# Patient Record
Sex: Female | Born: 1965 | Race: White | Hispanic: No | Marital: Married | Smoking: Never smoker
Health system: Southern US, Community
[De-identification: ages and names within clinical notes are randomized; demographics above are authoritative.]

## PROBLEM LIST (undated history)

## (undated) HISTORY — PX: NOVASURE ABLATION: SHX5394

## (undated) HISTORY — PX: LAPAROSCOPY: SHX197

---

## 2001-01-21 ENCOUNTER — Other Ambulatory Visit: Admission: RE | Admit: 2001-01-21 | Discharge: 2001-01-21 | Payer: Self-pay | Admitting: *Deleted

## 2001-07-20 ENCOUNTER — Inpatient Hospital Stay (HOSPITAL_COMMUNITY): Admission: AD | Admit: 2001-07-20 | Discharge: 2001-07-23 | Payer: Self-pay | Admitting: *Deleted

## 2001-07-20 ENCOUNTER — Encounter (INDEPENDENT_AMBULATORY_CARE_PROVIDER_SITE_OTHER): Payer: Self-pay | Admitting: Specialist

## 2001-08-30 ENCOUNTER — Other Ambulatory Visit: Admission: RE | Admit: 2001-08-30 | Discharge: 2001-08-30 | Payer: Self-pay | Admitting: *Deleted

## 2002-04-14 HISTORY — PX: BREAST ENHANCEMENT SURGERY: SHX7

## 2002-12-13 ENCOUNTER — Other Ambulatory Visit: Admission: RE | Admit: 2002-12-13 | Discharge: 2002-12-13 | Payer: Self-pay | Admitting: Gynecology

## 2004-03-05 ENCOUNTER — Encounter: Admission: RE | Admit: 2004-03-05 | Discharge: 2004-03-05 | Payer: Self-pay | Admitting: Gynecology

## 2004-03-19 ENCOUNTER — Other Ambulatory Visit: Admission: RE | Admit: 2004-03-19 | Discharge: 2004-03-19 | Payer: Self-pay | Admitting: Gynecology

## 2005-03-21 ENCOUNTER — Other Ambulatory Visit: Admission: RE | Admit: 2005-03-21 | Discharge: 2005-03-21 | Payer: Self-pay | Admitting: Gynecology

## 2005-03-27 ENCOUNTER — Encounter: Admission: RE | Admit: 2005-03-27 | Discharge: 2005-03-27 | Payer: Self-pay | Admitting: Gynecology

## 2005-04-25 ENCOUNTER — Ambulatory Visit (HOSPITAL_COMMUNITY): Admission: RE | Admit: 2005-04-25 | Discharge: 2005-04-25 | Payer: Self-pay | Admitting: Gynecology

## 2005-04-25 ENCOUNTER — Encounter (INDEPENDENT_AMBULATORY_CARE_PROVIDER_SITE_OTHER): Payer: Self-pay | Admitting: Specialist

## 2006-04-10 ENCOUNTER — Other Ambulatory Visit: Admission: RE | Admit: 2006-04-10 | Discharge: 2006-04-10 | Payer: Self-pay | Admitting: Gynecology

## 2007-08-03 ENCOUNTER — Other Ambulatory Visit: Admission: RE | Admit: 2007-08-03 | Discharge: 2007-08-03 | Payer: Self-pay | Admitting: Gynecology

## 2007-10-12 ENCOUNTER — Encounter: Admission: RE | Admit: 2007-10-12 | Discharge: 2007-10-12 | Payer: Self-pay | Admitting: Gynecology

## 2008-08-04 ENCOUNTER — Encounter: Payer: Self-pay | Admitting: Gynecology

## 2008-08-04 ENCOUNTER — Ambulatory Visit: Payer: Self-pay | Admitting: Gynecology

## 2008-08-04 ENCOUNTER — Other Ambulatory Visit: Admission: RE | Admit: 2008-08-04 | Discharge: 2008-08-04 | Payer: Self-pay | Admitting: Gynecology

## 2008-09-26 ENCOUNTER — Ambulatory Visit: Payer: Self-pay | Admitting: Gynecology

## 2008-10-02 ENCOUNTER — Ambulatory Visit: Payer: Self-pay | Admitting: Gynecology

## 2008-10-05 ENCOUNTER — Ambulatory Visit: Payer: Self-pay | Admitting: Women's Health

## 2008-10-12 ENCOUNTER — Encounter: Admission: RE | Admit: 2008-10-12 | Discharge: 2008-10-12 | Payer: Self-pay | Admitting: Gynecology

## 2009-06-26 ENCOUNTER — Ambulatory Visit: Payer: Self-pay | Admitting: Gynecology

## 2009-07-10 ENCOUNTER — Ambulatory Visit: Payer: Self-pay | Admitting: Gynecology

## 2010-04-05 ENCOUNTER — Ambulatory Visit: Payer: Self-pay | Admitting: Gynecology

## 2010-05-05 ENCOUNTER — Encounter: Payer: Self-pay | Admitting: Gynecology

## 2010-05-24 ENCOUNTER — Ambulatory Visit (HOSPITAL_BASED_OUTPATIENT_CLINIC_OR_DEPARTMENT_OTHER)
Admission: RE | Admit: 2010-05-24 | Discharge: 2010-05-24 | Disposition: A | Discharge status: Home / Self Care | Payer: BC Managed Care – PPO | Attending: General Surgery | Admitting: General Surgery

## 2010-05-24 ENCOUNTER — Other Ambulatory Visit: Payer: Self-pay | Admitting: General Surgery

## 2010-05-24 DIAGNOSIS — D1739 Benign lipomatous neoplasm of skin and subcutaneous tissue of other sites: Secondary | ICD-10-CM | POA: Insufficient documentation

## 2010-05-24 LAB — POCT HEMOGLOBIN-HEMACUE: Hemoglobin: 12.4 g/dL (ref 12.0–15.0)

## 2010-05-30 NOTE — Op Note (Addendum)
  NAME:  Anita Mason, Anita Mason               ACCOUNT NO.:  [id number]  MEDICAL RECORD NO.:  [id number]           PATIENT TYPE:  LOCATION:                                 FACILITY:  PHYSICIAN:  Lennie Muckle, MD      DATE OF BIRTH:  [birth year]  DATE OF PROCEDURE:  05/24/2010 DATE OF DISCHARGE:                              OPERATIVE REPORT   PREOPERATIVE DIAGNOSIS:  Lipoma, right chest wall.  POSTOPERATIVE DIAGNOSIS:  Lipoma, right chest wall.  PROCEDURE:  Excision of lipoma.  SURGEON:  Lennie Muckle, MD  ASSISTANT:  OR staff.  ESTIMATED BLOOD LOSS:  Minimal amount of blood loss.  ANESTHESIA:  General endotracheal anesthesia.  SPECIMENS:  Lipomatous tissue, 4 x 3.  COMPLICATIONS:  No immediate complications.  INDICATIONS FOR PROCEDURE:  Ms. __________  is a 45 year old female who had a right swelling on her chest wall.  It had been present for some years.  It is rather uncomfortable due to her activity.  She desired to have this excised.  DETAILS OF PROCEDURE:  Ms. __________  was identified in the preoperative holding area.  I marked the area along with her in the preoperative holding area.  She was seen by Anesthesia and taken to the operating room.  Once in the operating room, she was placed in supine position.  After administration of anesthesia, the right chest wall was prepped and draped in usual sterile fashion.  Surgical time-out performed.  I began by injecting 0.25% Marcaine into the tissues and placed a small incision with a #15 blade, dissected with electrocautery. The specimen was loosely adherent to the external oblique muscles.  I was able to fully remove this intact with electrocautery.  There was minimal amount of bleeding.  It was passed off the operative field.  I injected Marcaine into external oblique muscles.  I inspected and found no bleeding.  I reapproximated the dermis with a 3-0 Vicryl.  Skin was closed with 4-0 Monocryl, placed Steri-Strips, dry  gauze, and Tegaderm for final dressing.  The patient was awoken and transferred to postanesthesia care unit in stable condition.  She will be discharged home with Vicodin and followup with me in 2-3 weeks.     Lennie Muckle, MD     ALA/MEDQ  D:  05/24/2010  T:  05/25/2010  Job:  [id number]  Electronically Signed by Bertram Savin MD on 05/30/2010 02:17:04 PM

## 2010-08-30 NOTE — Op Note (Signed)
NAME:  Anita Mason, Anita Mason               ACCOUNT NO.:  [id number]   MEDICAL RECORD NO.:  [id number]          PATIENT TYPE:  AMB   LOCATION:  [facility]                           FACILITY:  [facility]   PHYSICIAN:  Timothy P. Fontaine, M.D.DATE OF BIRTH:  01/18/1966   DATE OF PROCEDURE:  04/25/2005  DATE OF DISCHARGE:                                 OPERATIVE REPORT   PREOPERATIVE DIAGNOSES:  Menorrhagia.   POSTOPERATIVE DIAGNOSES:  Menorrhagia.   PROCEDURE:  NovaSure endometrial ablation, hysteroscopy D&C.   SURGEON:  Timothy P. Fontaine, M.D.   ANESTHESIA:  MAC with 1% lidocaine paracervical block.   SPECIMENS:  Endometrial curetting.   ESTIMATED BLOOD LOSS:  Minimal.   COMPLICATIONS:  None.   SORBITOL DISCREPANCY:  Minimal.   FINDINGS:  Post abortive hysteroscopy grossly normal, symmetrical ablation,  good distention, no evidence of perforation.   DESCRIPTION OF PROCEDURE:  The patient was taken to the operating room and  underwent MAC anesthesia, placed in the dorsal lithotomy position, received  a perineal vaginal preparation with Betadine solution, bladder emptied with  in and out Foley catheterization per nursing personnel. An EUA was performed  noting the uterus to be significantly anteverted. The cervix was visualized  with a tenaculum, anterior lip grasped with a single tooth tenaculum and a  paracervical block placed using 1% lidocaine approximately 8 mL total. The  uterus was then sounded to 8 cm, cervical length calculated at 3 cm and a  sharp curettage was performed and sent to pathology. The NovaSure was  placed. The array was opened and manipulation ensured proper placement. The  cavity width was calculated at 3.5. The carbon dioxide sleeve was advanced,  carbon dioxide test performed and passed. Subsequently the NovaSure ablation  performed without difficulty. The instrument was removed, hysteroscopy  showed empty  cavity, good distention, no evidence of perforation.  Instruments removed,  adequate hemostasis visualized, patient placed in supine position, awakened  without difficulty and taken to the recovery room in good condition having  tolerated the procedure well.      Timothy P. Fontaine, M.D.  Electronically Signed     TPF/MEDQ  D:  04/25/2005  T:  04/25/2005  Job:  [id number]

## 2010-08-30 NOTE — Discharge Summary (Signed)
[facility]  Patient:    Anita Mason, Anita Mason Visit Number: [id number] MRN: [id number]          Service Type: OBS Location: 910A [id number] [id number] Attending Physician:  Wetzel Bjornstad Dictated by:   Antony Contras, [facility] Admit Date:  07/20/2001 Discharge Date: 07/23/2001                             Discharge Summary  DISCHARGE DIAGNOSES:          1. Intrauterine pregnancy at 39 weeks.                               2. Elective repeat cesarean section.                               3. Multiparity, desires sterility.  PROCEDURE:                    Repeat low transverse cesarean section, postpartum tubal sterilization.  HISTORY OF PRESENT ILLNESS:   The patient is a 45 year old, gravida 2, para 0-1-0-2, LMP November 14, 2000, [facility] July 18, 2001.  Prenatal risk factors included advanced maternal age, also history of previous low transverse cesarean section, desires repeat, also a history of PIH at 35 weeks with previous pregnancy.  PRENATAL LABORATORY DATA:     Blood type O positive, antibody screen negative. RPR, HBSAG, HIV nonreactive.  Amniocentesis normal.  HOSPITAL COURSE:              The patient was admitted on July 20, 2001. Repeat low transverse cesarean section with tubal sterilization was performed by Dr. Penni Homans and assisted by Dr. Lily Peer under spinal anesthesia.  The patient was delivered of a 7 pound 2 ounce female with Apgars of 9 and 9, vertex position, three vessel cord, normal appearing uterus, tubes, and ovaries. Postpartum course the patient remained afebrile, had no difficulty voiding, and was able to be discharged in satisfactory condition on her third postoperative day.  CBC; hematocrit 24.5, hemoglobin 8.5, WBC 10.4, platelets 189.  DISPOSITION:                  Follow up in six weeks.  Continue prenatal vitamins and iron.  Motrin or Tylox for pain. Dictated by:   Antony Contras, [facility] Attending Physician:  Wetzel Bjornstad DD:   08/16/01 TD:  08/18/01 Job: [id number]

## 2010-08-30 NOTE — Op Note (Signed)
[facility]  Patient:    Anita Mason, Anita Mason Visit Number: [id number] MRN: [id number]          Service Type: OBS Location: [facility] Attending Physician:  Wetzel Bjornstad Dictated by:   Katy Fitch, M.D. Proc. Date: 07/20/01                             Operative Report  PREOPERATIVE DIAGNOSES:       1. A 39 week intrauterine pregnancy.                               2. Elective repeat cesarean section.                               3. Multiparity and desires sterility.  POSTOPERATIVE DIAGNOSES:      1. A 39 week intrauterine pregnancy.                               2. Elective repeat cesarean section.                               3. Multiparity and desires sterility.  OPERATION:                    Repeat low transverse cesarean section with                               postpartum tubal ligation.  SURGEON:                      Katy Fitch, M.D.  ASSISTANTGaetano Hawthorne. Lily Peer, M.D.  ANESTHESIA:                   Spinal.  ESTIMATED BLOOD LOSS:         750.  URINE OUTPUT:                 150 and clear.  FLUIDS:                       2700 of crystalloid.  COMPLICATIONS:                None.  SPECIMENS:                    Midportion of fallopian tubes bilaterally.  FINDINGS:                     Living 7 pound and 2 ounce female with Apgars of 9 and 9 in vertex position with a three vessel cord and normal-appearing uterus, tubes, ovaries, and placenta.  DESCRIPTION OF PROCEDURE:     The patient was taken to the operating room where spinal anesthesia was difficulty after finding the patient adequately anesthetized.  The patient was then prepped and draped in the normal sterile fashion.  A Foley catheter was inserted into the bladder without any difficulty.  A Pfannenstiel incision was made through the previous incision and carried down to  the underlying fascia.  The fascia was then extended transversely using curved  Mayo scissors.  The underlying rectus muscles were then dissected off both bluntly and sharply using curved Mayo scissors.  The rectus muscles were then separated in the midline and the underlying peritoneum was entered sharply.  The peritoneum was extended superiorly and then inferiorly to the level of the bladder.  A bladder blade was then placed in the pelvis, and noted on the uterus at the bladder reflection, multiple venous sinuses.  At this point, the incision was made approximately 3 cm above the level of the bladder flap without creating a bladder flap at that point due to the vascularity.  Entrance into the uterus was performed and the incision was extended manually.  The infants head was then delivered atraumatically along with the rest of the body and the mouth and nose were suctioned.  The cord was clamped and cut, and the infant handed off to the awaiting attendants.  Cord blood was obtained and the placenta was massaged from the uterus.  The uterus was then exteriorized and cleared of all clots and debris.  At this point, the bladder flap was then created using Metzenbaum scissors due to the venous sinuses being markedly diminished.  A running 1-0 chromic interlocking stitch was then used to close the lower uterine segment.  A pack was then placed along the incision and the posterior cul-de-sac was cleared of all clots and debris using warm normal saline, irrigation, and suction.  A Tanja Port was then placed on the left tube and a 0 plain was used to doubly ligate the tube and then excised with Metzenbaum scissors.  Inspection was noted to be hemostatic.  This was then done similarly on the right with double ligation with 0 plain.  The uterus was then placed back into the abdominal cavity without any difficulties.  Inspection of the tubes were noted to be hemostatic once again.  There were several areas of bleeders on the incision at which two figure-of-eights of chronic were  used to close.  A liter of irrigation was once again used and hemostasis was noted.  After irrigation, the fascia was then closed using 0 Vicryl in a running continuous stitch.  The subcutaneous tissue was irrigated with warm normal saline, and Bovie was used to cauterize any bleeders, and the skin was closed using staples.  The patient was taken to the recovery room in stable condition. Dictated by:   Katy Fitch, M.D. Attending Physician:  Wetzel Bjornstad DD:  07/20/01 TD:  07/20/01 Job: [id number]

## 2010-08-30 NOTE — H&P (Signed)
NAME:  Anita Mason, Anita Mason               ACCOUNT NO.:  [id number]   MEDICAL RECORD NO.:  [id number]          PATIENT TYPE:  AMB   LOCATION:  SDC                           FACILITY:  [facility]   PHYSICIAN:  Timothy P. Fontaine, M.D.DATE OF BIRTH:  [birth year]   DATE OF ADMISSION:  04/25/2005  DATE OF DISCHARGE:                                HISTORY & PHYSICAL   CHIEF COMPLAINT:  Menorrhagia.   HISTORY OF PRESENT ILLNESS:  A 45 year old gravida 2, para 3, female status  post tubal sterilization who complains of increasing menorrhagia.  The  patient underwent sonohysterogram which was normal and thyroid study which  was negative.  She does have a hemoglobin of 11.7 consistent with iron  deficiency.  Options for management were reviewed and she has elected for  endometrial ablation and is admitted at this time for NovaSure endometrial  ablation.   PAST MEDICAL HISTORY:  Uncomplicated.   PAST SURGICAL HISTORY:  Laparoscopy, cesarean section x2, tubal  sterilization.  Breast augmentation.   ALLERGIES:  No known drug allergies.   MEDICATIONS:  None.   REVIEW OF SYSTEMS:  Noncontributory.   FAMILY HISTORY:  Noncontributory.   SOCIAL HISTORY:  Noncontributory.   PHYSICAL EXAMINATION:  VITAL SIGNS:  Afebrile, vital signs are stable.  HEENT:  Normal.  LUNGS:  Clear.  HEART:  Regular rate, no murmurs, rubs, or gallops.  ABDOMEN:  Benign.  PELVIC:  External BUS and vagina normal.  Cervix normal.  Uterus normal  size, shape, and contour, midline and mobile.  Adnexa without masses or  tenderness.   ASSESSMENT:  A 45 year old gravida 2, para 3 female, status post tubal  sterilization, increasing menorrhagia, normal thyroid, normal  sonohysterogram, CBC consistent with iron deficiency for NovaSure  endometrial ablation.  The risks, benefits, indications, and alternatives  for the procedure were reviewed with the patient to include the expected  intraoperative, postoperative courses.  She  understands that she should  never achieve pregnancy following this procedure.  She has had a tubal  sterilization, but understands that she should never get pregnant following  this.  She also understands that the procedure is machine dependent, that if  there is a dysfunction in the procedure machine, that we would not be able  to do the ablation at that time.  She also understands there are no  guarantees as far as bleeding relief, that there are inherent failures, and  that her abnormal bleeding may persist.  The acute risks including bleeding,  infection, uterine perforation, damage to internal organs either directly or  thermally to bowel,  bladder, ureters, vessels, and nerves necessitating major exploratory  reparative surgeries and future reparative surgeries including ostomy  formation were all discussed, understood, and accepted.  The patient's  questions were answered to her satisfaction and she is ready to proceed with  surgery.      Timothy P. Fontaine, M.D.  Electronically Signed     TPF/MEDQ  D:  04/23/2005  T:  04/23/2005  Job:  [id number]

## 2010-10-29 ENCOUNTER — Ambulatory Visit (INDEPENDENT_AMBULATORY_CARE_PROVIDER_SITE_OTHER): Payer: BC Managed Care – PPO | Admitting: Gynecology

## 2010-10-29 DIAGNOSIS — N898 Other specified noninflammatory disorders of vagina: Secondary | ICD-10-CM

## 2010-10-29 DIAGNOSIS — R82998 Other abnormal findings in urine: Secondary | ICD-10-CM

## 2010-10-29 DIAGNOSIS — B373 Candidiasis of vulva and vagina: Secondary | ICD-10-CM

## 2010-10-30 ENCOUNTER — Encounter: Payer: Self-pay | Admitting: *Deleted

## 2011-01-16 ENCOUNTER — Other Ambulatory Visit: Payer: Self-pay | Admitting: Obstetrics and Gynecology

## 2011-05-08 ENCOUNTER — Other Ambulatory Visit: Payer: Self-pay | Admitting: Family Medicine

## 2011-05-08 DIAGNOSIS — R591 Generalized enlarged lymph nodes: Secondary | ICD-10-CM

## 2011-05-13 ENCOUNTER — Ambulatory Visit
Admission: RE | Admit: 2011-05-13 | Discharge: 2011-05-13 | Disposition: A | Discharge status: Home / Self Care | Payer: BC Managed Care – PPO | Source: Ambulatory Visit | Attending: Family Medicine | Admitting: Family Medicine

## 2011-05-13 DIAGNOSIS — R591 Generalized enlarged lymph nodes: Secondary | ICD-10-CM

## 2012-02-16 ENCOUNTER — Other Ambulatory Visit: Payer: Self-pay | Admitting: Obstetrics and Gynecology

## 2012-03-04 ENCOUNTER — Other Ambulatory Visit: Payer: Self-pay | Admitting: Obstetrics and Gynecology

## 2013-04-18 IMAGING — US US SOFT TISSUE HEAD/NECK
1 series · 14 of 22 positions shown · non-contrast
Comparison: None.

CLINICAL DATA: Left palpable cervical region without pain.

ULTRASOUND OF HEAD/NECK SOFT TISSUES
TECHNIQUE: Ultrasound examination of the head and neck soft
tissues was performed in the area of clinical concern.

[Series 1: us soft tissue head/neck · 0.07mm/px · 14 of 22 slices shown]
[im 1/22]
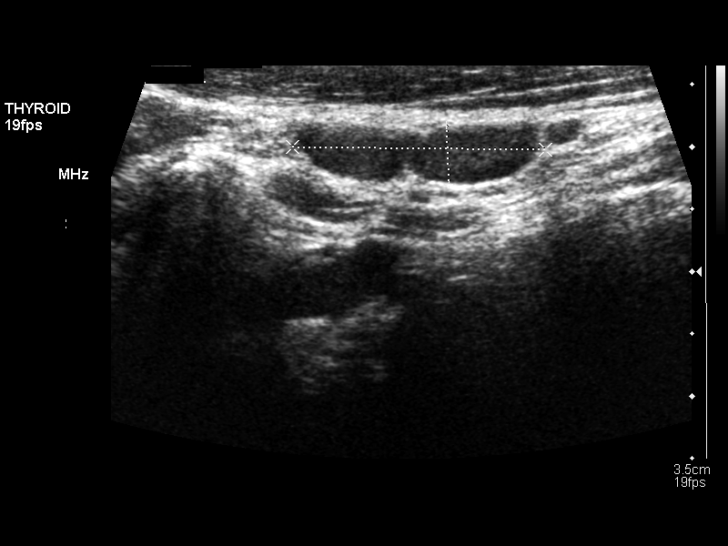
[im 3/22]
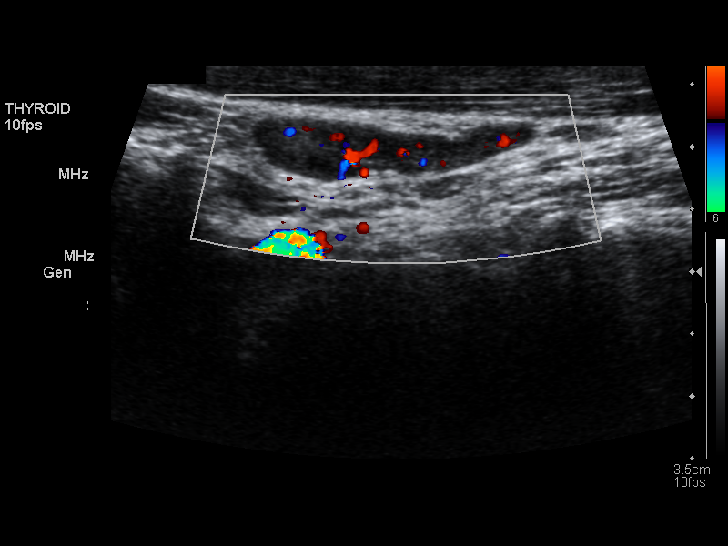
[im 4/22]
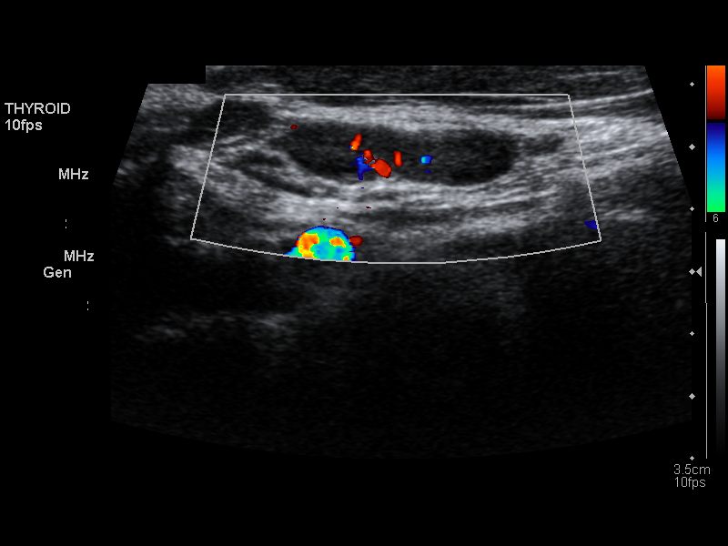
[im 6/22]
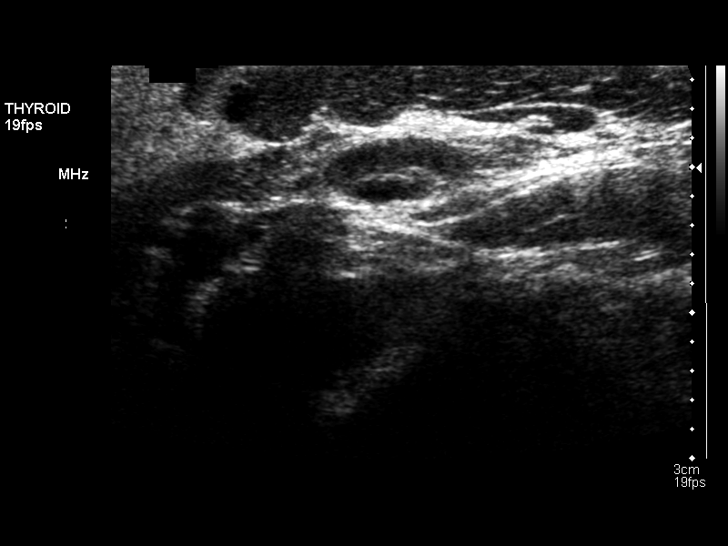
[im 8/22]
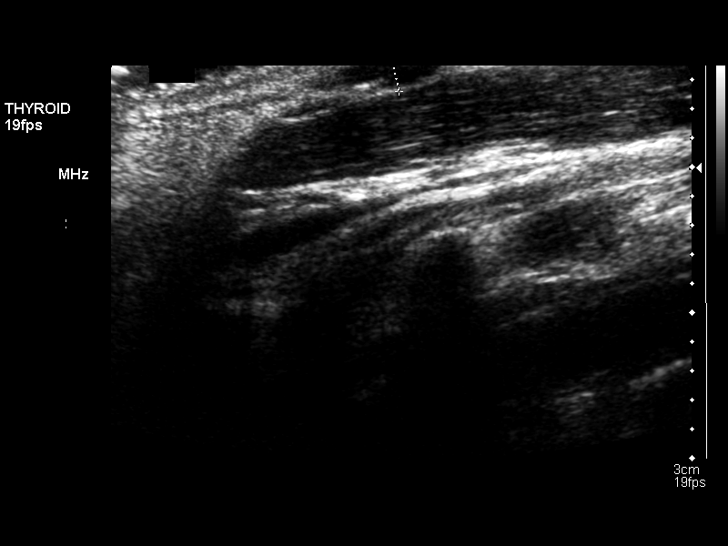
[im 9/22]
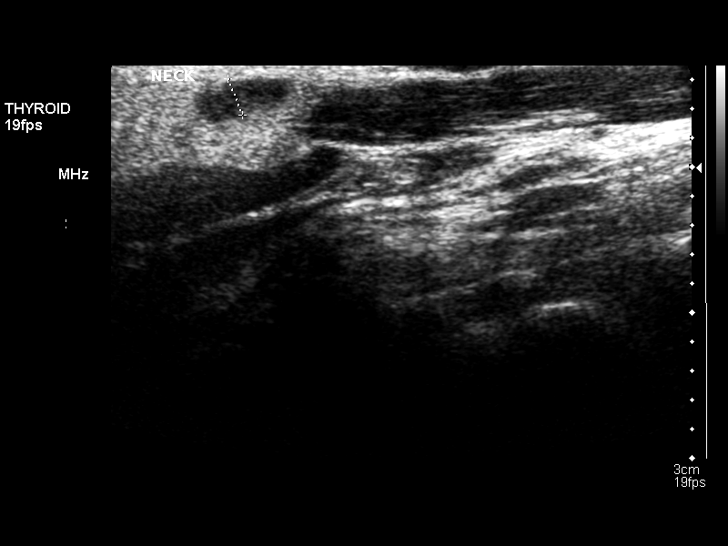
[im 11/22]
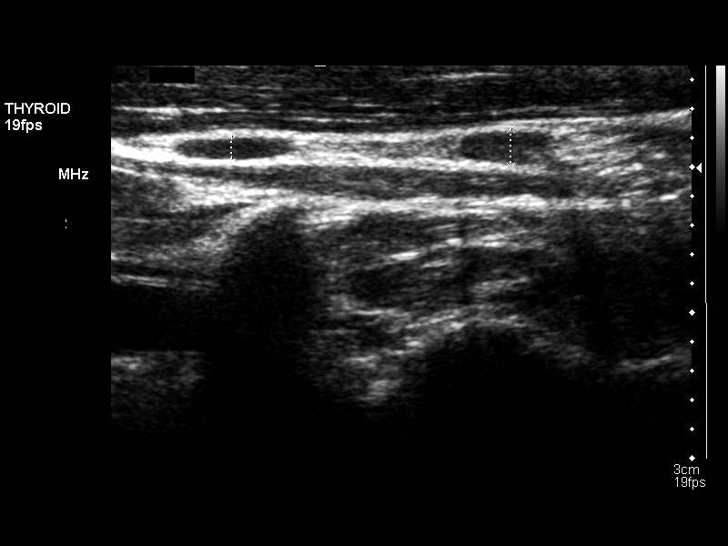
[im 12/22]
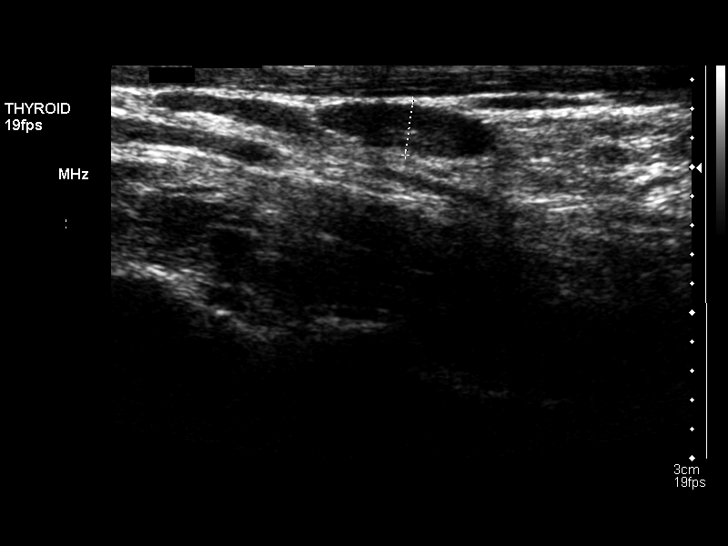
[im 14/22]
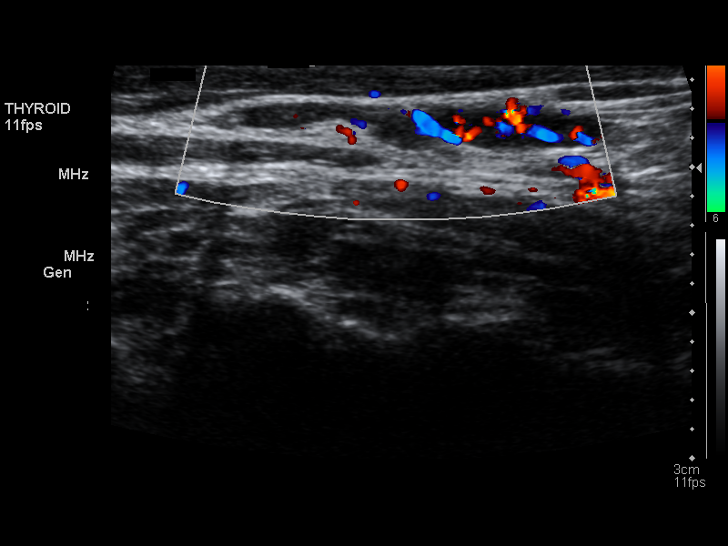
[im 15/22]
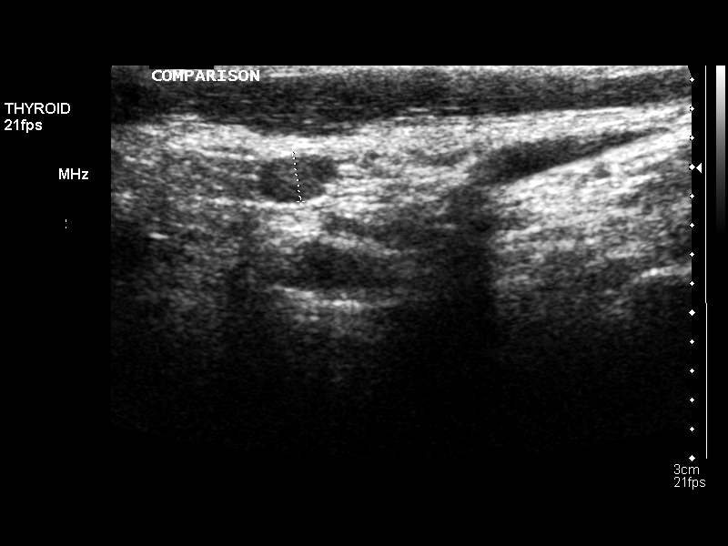
[im 17/22]
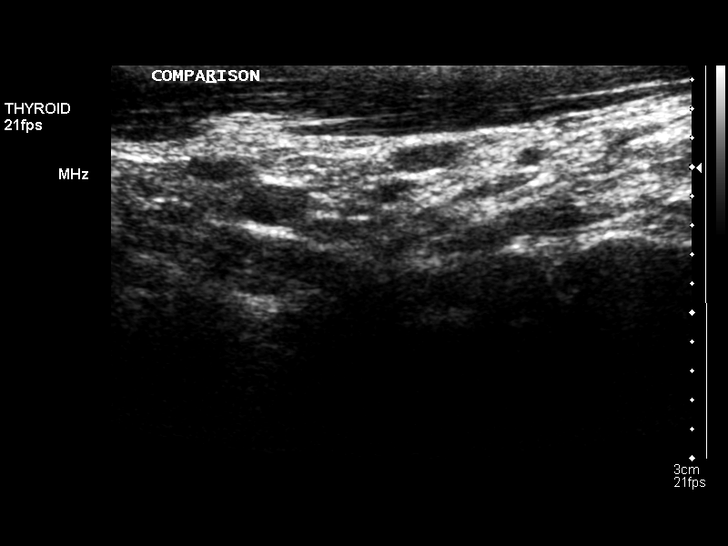
[im 19/22]
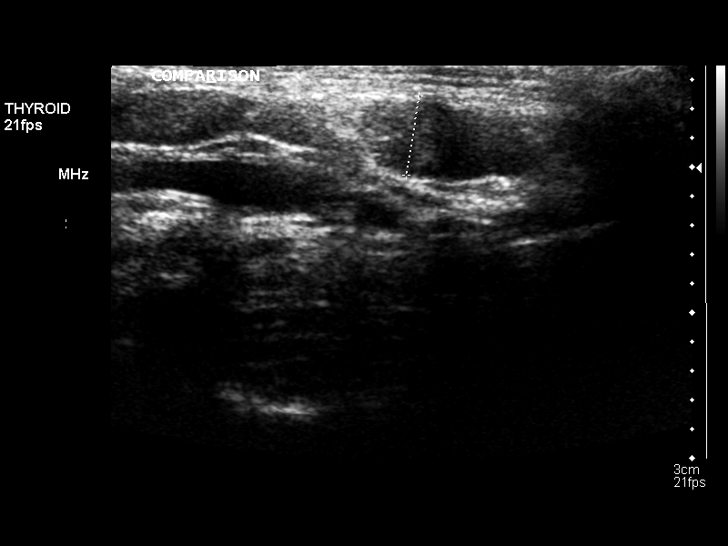
[im 20/22]
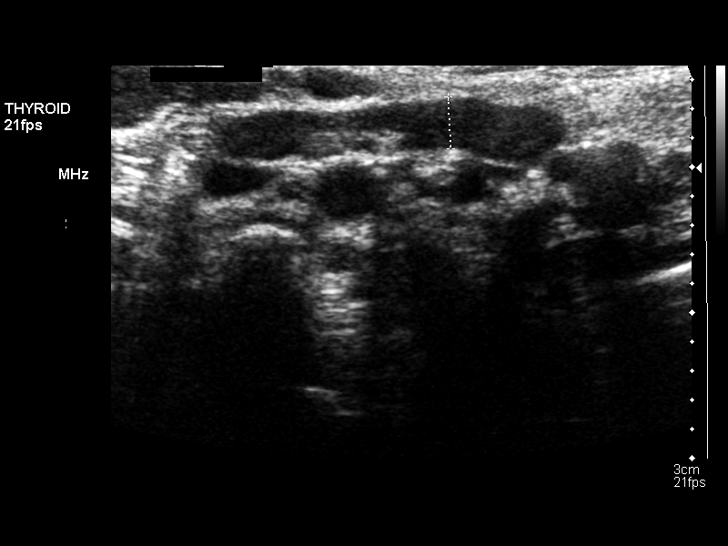
[im 22/22]
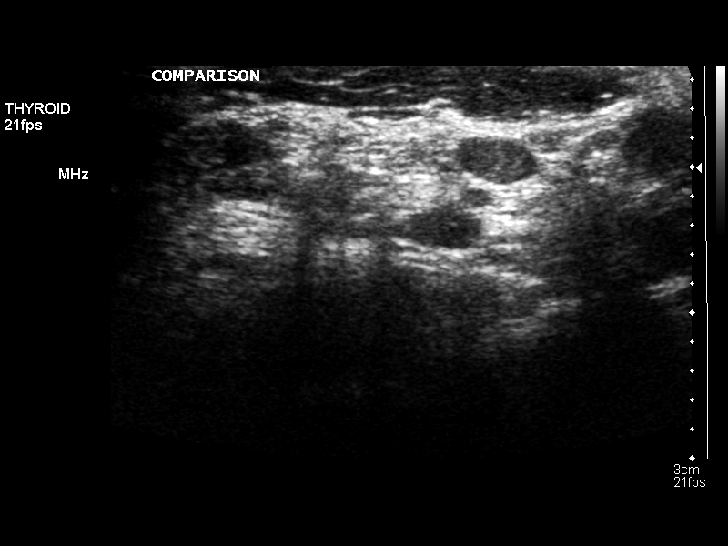

[14 of 22 positions shown; findings below may reference images not displayed]

FINDINGS: Survey ultrasound in the region of palpable concern in
the left lateral neck demonstrates scattered normal sized cervical
nodes, all measuring less than 5 mm short axis diameter.
Comparison views of the right neck demonstrate similar normal-
appearing cervical nodes.  No evidence of mass, fluid collection,
or adenopathy.
IMPRESSION: 1.  Normal bilateral cervical nodes by size criteria and
morphology.  No mass or other lesion.

## 2013-06-02 ENCOUNTER — Other Ambulatory Visit: Payer: Self-pay | Admitting: Family Medicine

## 2013-06-02 ENCOUNTER — Ambulatory Visit
Admission: RE | Admit: 2013-06-02 | Discharge: 2013-06-02 | Disposition: A | Discharge status: Home / Self Care | Payer: BC Managed Care – PPO | Source: Ambulatory Visit | Attending: Family Medicine | Admitting: Family Medicine

## 2013-06-02 DIAGNOSIS — T691XXA Chilblains, initial encounter: Secondary | ICD-10-CM

## 2014-02-20 ENCOUNTER — Other Ambulatory Visit: Payer: Self-pay | Admitting: Obstetrics and Gynecology

## 2014-02-21 LAB — CYTOLOGY - PAP

## 2015-05-09 IMAGING — CR DG FOOT COMPLETE 3+V*L*
3 series · 3 of 3 positions shown · non-contrast
Comparison: None.

CLINICAL DATA: Foot pain.

EXAM:
LEFT FOOT - COMPLETE 3+ VIEW

[view not recorded (1 of 3)]
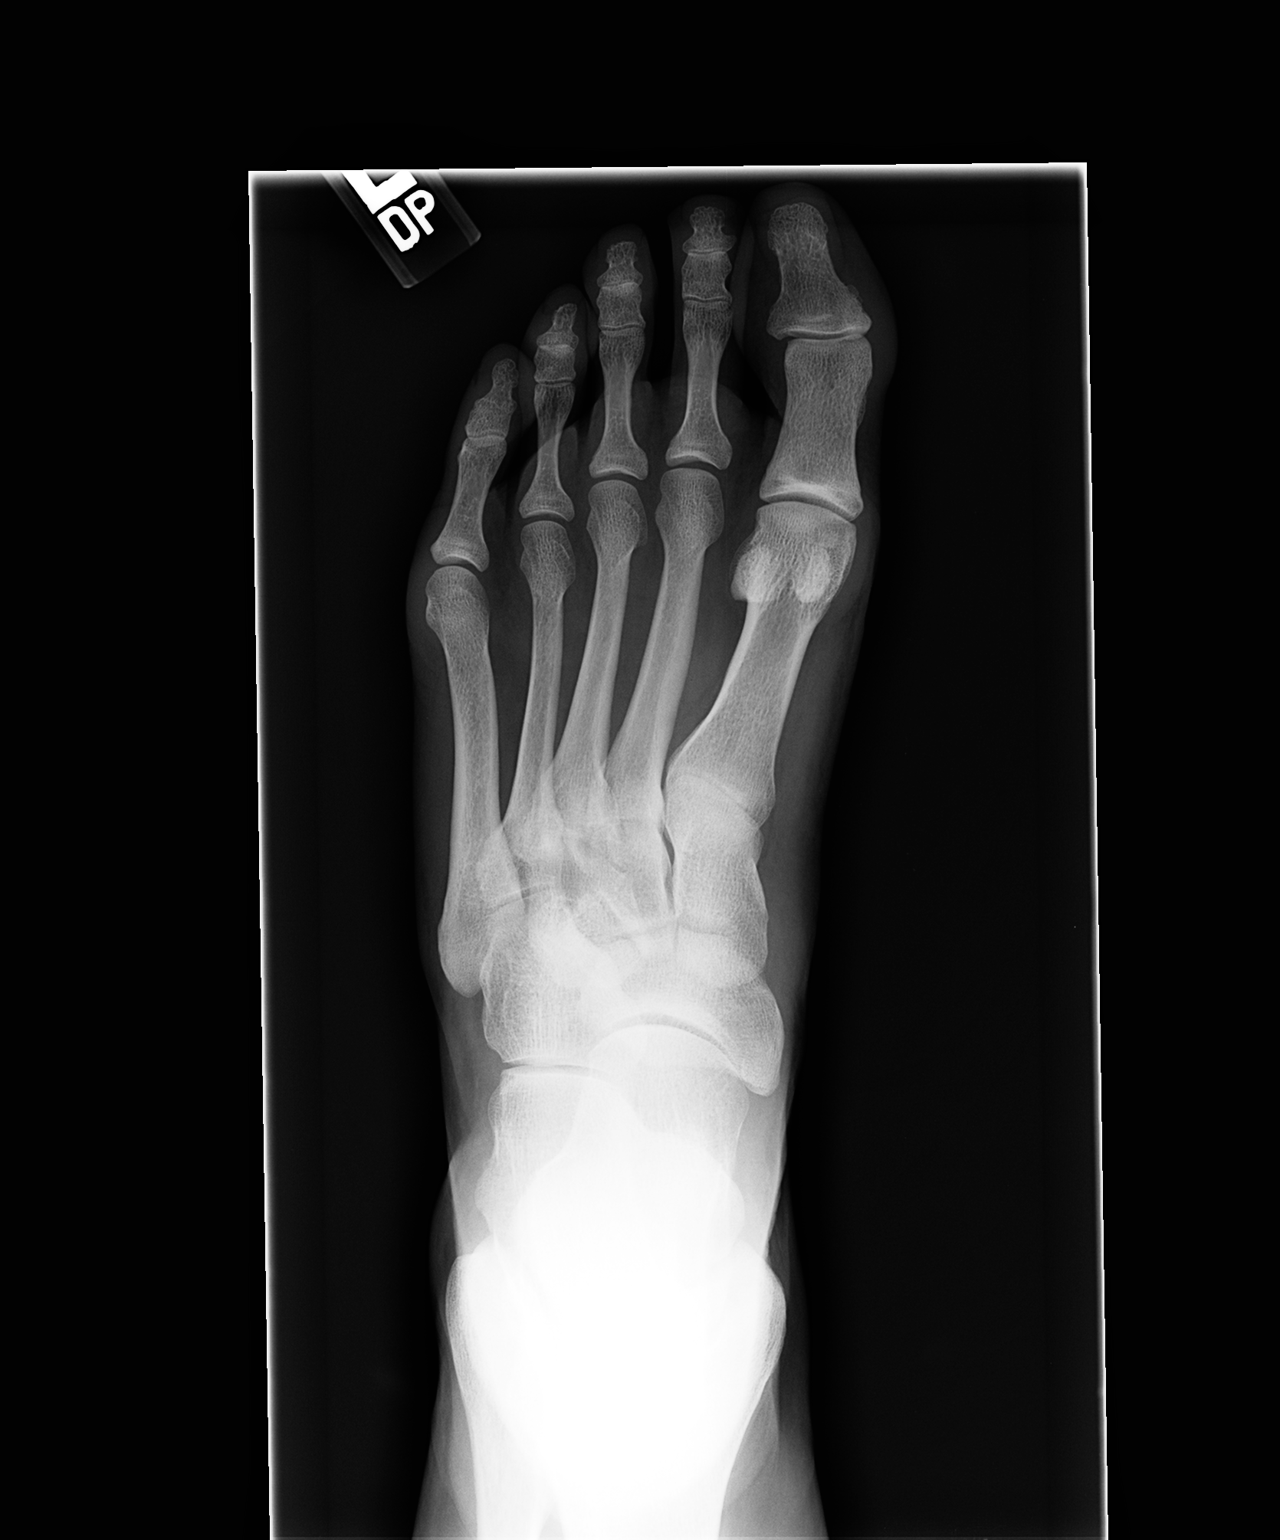

[view not recorded (2 of 3)]
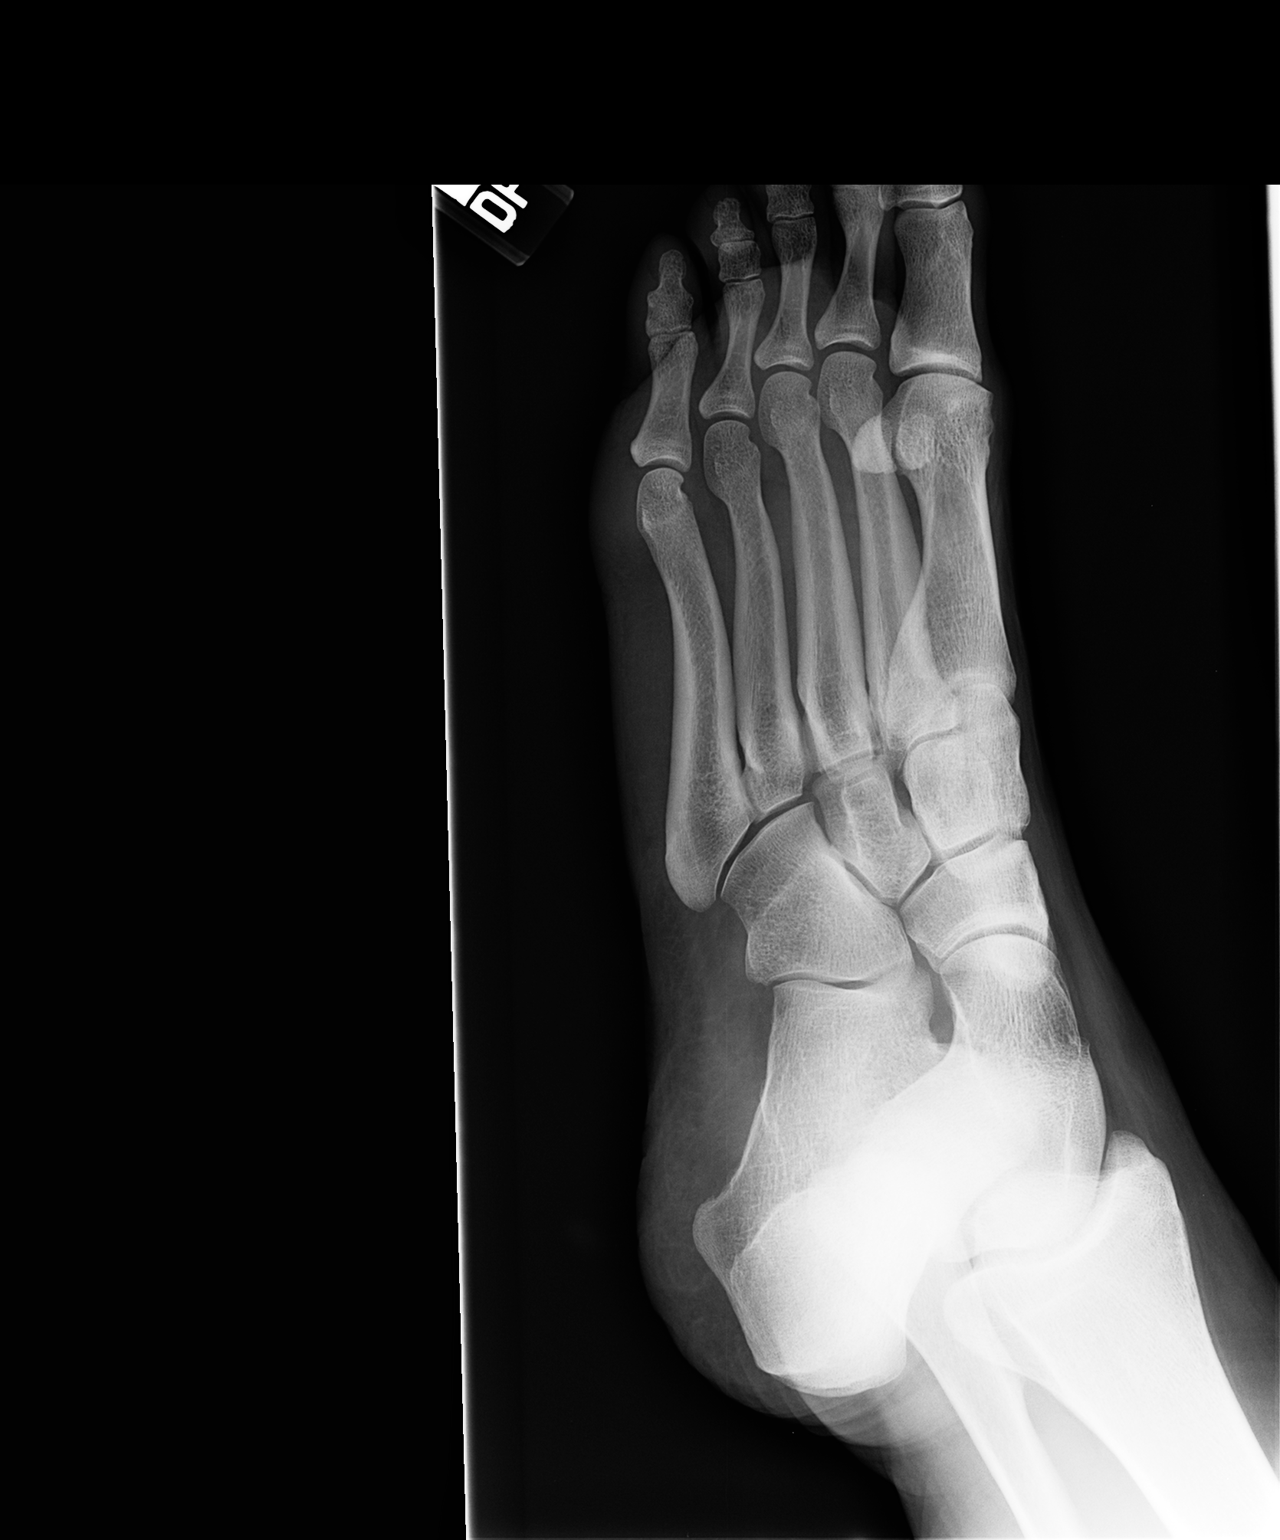

[view not recorded (3 of 3)]
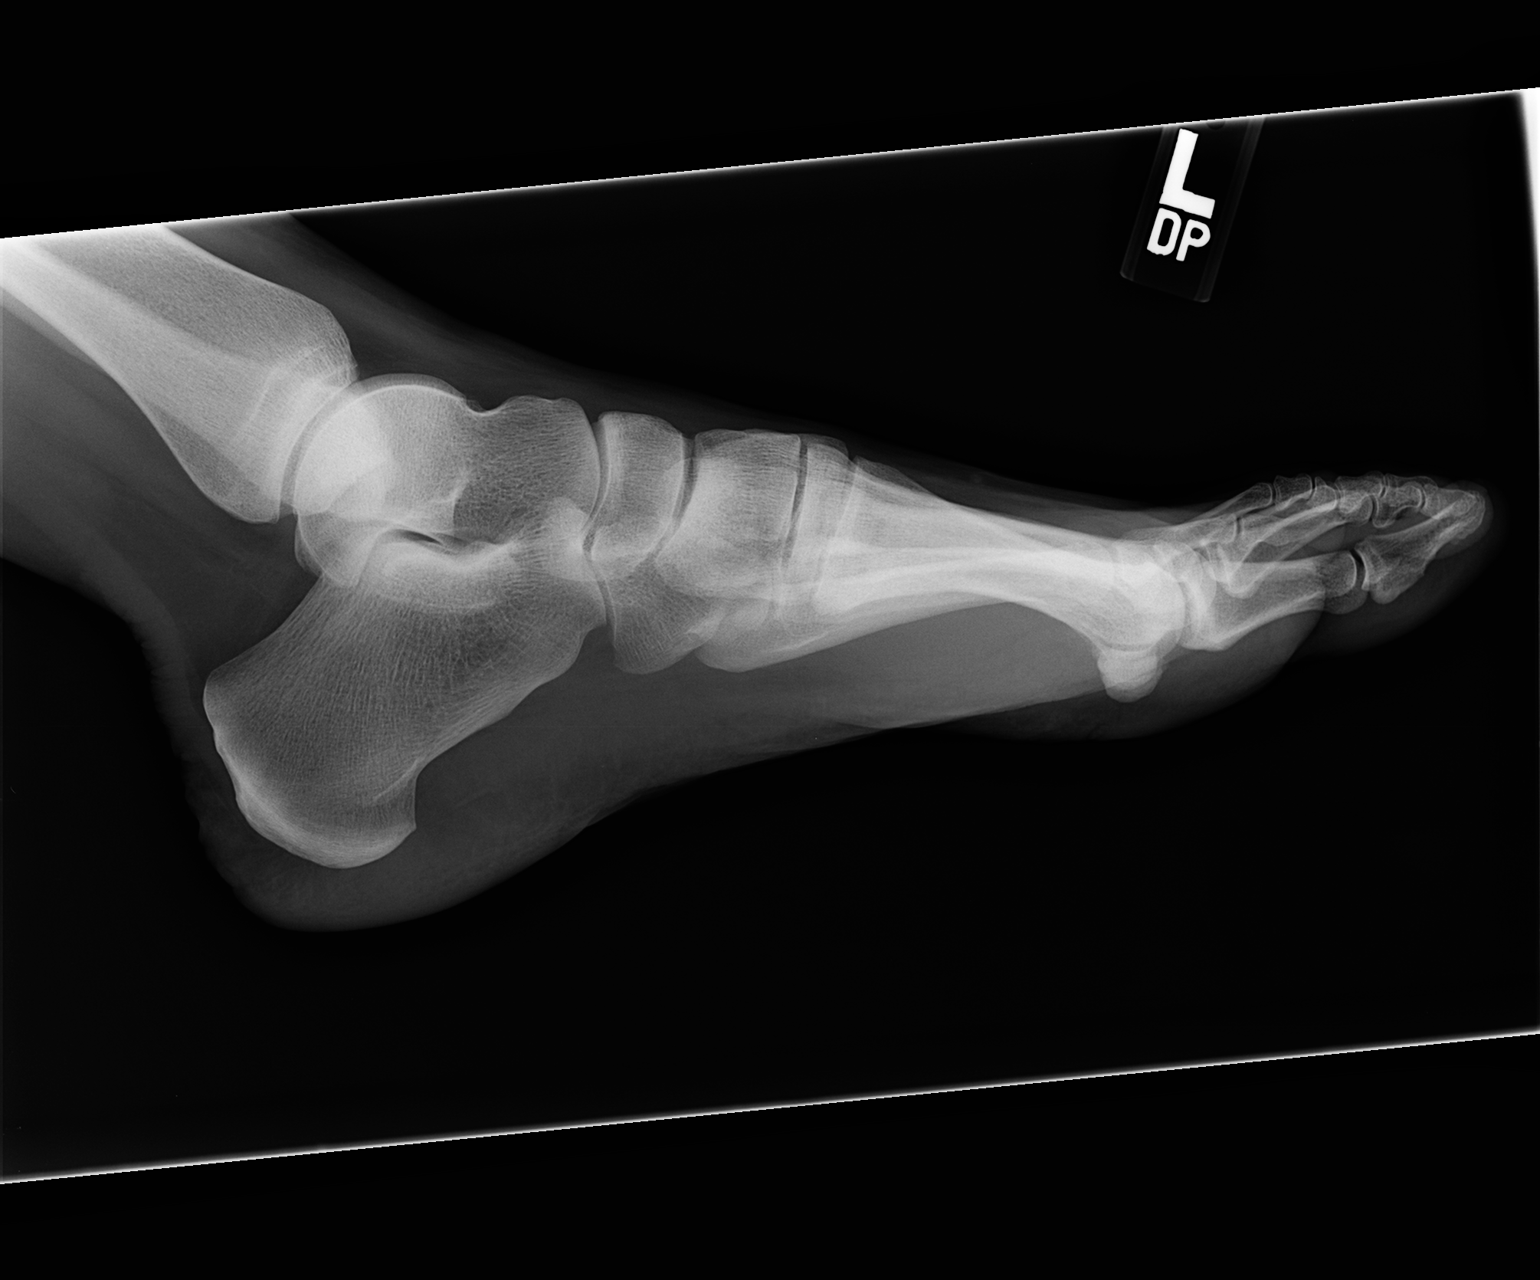

[3 of 3 positions shown; findings below may reference images not displayed]

FINDINGS: The joint spaces are maintained. No acute fracture. No degenerative
changes.
IMPRESSION: No acute bony findings.

## 2015-12-24 DIAGNOSIS — N950 Postmenopausal bleeding: Secondary | ICD-10-CM | POA: Diagnosis not present

## 2015-12-24 DIAGNOSIS — N939 Abnormal uterine and vaginal bleeding, unspecified: Secondary | ICD-10-CM | POA: Diagnosis not present

## 2016-04-13 DIAGNOSIS — J060 Acute laryngopharyngitis: Secondary | ICD-10-CM | POA: Diagnosis not present

## 2016-04-13 DIAGNOSIS — R05 Cough: Secondary | ICD-10-CM | POA: Diagnosis not present

## 2016-04-25 DIAGNOSIS — J329 Chronic sinusitis, unspecified: Secondary | ICD-10-CM | POA: Diagnosis not present

## 2016-04-25 DIAGNOSIS — R05 Cough: Secondary | ICD-10-CM | POA: Diagnosis not present

## 2016-06-18 DIAGNOSIS — N390 Urinary tract infection, site not specified: Secondary | ICD-10-CM | POA: Diagnosis not present

## 2016-07-18 DIAGNOSIS — N390 Urinary tract infection, site not specified: Secondary | ICD-10-CM | POA: Diagnosis not present

## 2016-10-20 DIAGNOSIS — Z6821 Body mass index (BMI) 21.0-21.9, adult: Secondary | ICD-10-CM | POA: Diagnosis not present

## 2016-10-20 DIAGNOSIS — Z1231 Encounter for screening mammogram for malignant neoplasm of breast: Secondary | ICD-10-CM | POA: Diagnosis not present

## 2016-10-20 DIAGNOSIS — Z01419 Encounter for gynecological examination (general) (routine) without abnormal findings: Secondary | ICD-10-CM | POA: Diagnosis not present

## 2016-10-23 DIAGNOSIS — Z0000 Encounter for general adult medical examination without abnormal findings: Secondary | ICD-10-CM | POA: Diagnosis not present

## 2016-10-23 DIAGNOSIS — Z23 Encounter for immunization: Secondary | ICD-10-CM | POA: Diagnosis not present

## 2016-10-23 DIAGNOSIS — Z79899 Other long term (current) drug therapy: Secondary | ICD-10-CM | POA: Diagnosis not present

## 2017-03-30 DIAGNOSIS — N921 Excessive and frequent menstruation with irregular cycle: Secondary | ICD-10-CM | POA: Diagnosis not present

## 2017-03-30 DIAGNOSIS — N950 Postmenopausal bleeding: Secondary | ICD-10-CM | POA: Diagnosis not present

## 2017-05-05 DIAGNOSIS — H40013 Open angle with borderline findings, low risk, bilateral: Secondary | ICD-10-CM | POA: Diagnosis not present

## 2017-05-05 DIAGNOSIS — H04123 Dry eye syndrome of bilateral lacrimal glands: Secondary | ICD-10-CM | POA: Diagnosis not present

## 2017-09-15 DIAGNOSIS — H40013 Open angle with borderline findings, low risk, bilateral: Secondary | ICD-10-CM | POA: Diagnosis not present

## 2017-09-15 DIAGNOSIS — H04123 Dry eye syndrome of bilateral lacrimal glands: Secondary | ICD-10-CM | POA: Diagnosis not present

## 2017-11-19 DIAGNOSIS — F4322 Adjustment disorder with anxiety: Secondary | ICD-10-CM | POA: Diagnosis not present

## 2017-12-04 DIAGNOSIS — Z13220 Encounter for screening for lipoid disorders: Secondary | ICD-10-CM | POA: Diagnosis not present

## 2017-12-04 DIAGNOSIS — Z79899 Other long term (current) drug therapy: Secondary | ICD-10-CM | POA: Diagnosis not present

## 2017-12-04 DIAGNOSIS — Z0000 Encounter for general adult medical examination without abnormal findings: Secondary | ICD-10-CM | POA: Diagnosis not present

## 2017-12-22 DIAGNOSIS — H04123 Dry eye syndrome of bilateral lacrimal glands: Secondary | ICD-10-CM | POA: Diagnosis not present

## 2017-12-22 DIAGNOSIS — H2513 Age-related nuclear cataract, bilateral: Secondary | ICD-10-CM | POA: Diagnosis not present

## 2017-12-22 DIAGNOSIS — H40013 Open angle with borderline findings, low risk, bilateral: Secondary | ICD-10-CM | POA: Diagnosis not present

## 2017-12-22 DIAGNOSIS — Q141 Congenital malformation of retina: Secondary | ICD-10-CM | POA: Diagnosis not present

## 2018-01-21 DIAGNOSIS — Z01419 Encounter for gynecological examination (general) (routine) without abnormal findings: Secondary | ICD-10-CM | POA: Diagnosis not present

## 2018-01-21 DIAGNOSIS — Z806 Family history of leukemia: Secondary | ICD-10-CM | POA: Diagnosis not present

## 2018-01-21 DIAGNOSIS — Z1231 Encounter for screening mammogram for malignant neoplasm of breast: Secondary | ICD-10-CM | POA: Diagnosis not present

## 2018-01-21 DIAGNOSIS — Z801 Family history of malignant neoplasm of trachea, bronchus and lung: Secondary | ICD-10-CM | POA: Diagnosis not present

## 2018-01-21 DIAGNOSIS — Z8041 Family history of malignant neoplasm of ovary: Secondary | ICD-10-CM | POA: Diagnosis not present

## 2018-01-21 DIAGNOSIS — Z6821 Body mass index (BMI) 21.0-21.9, adult: Secondary | ICD-10-CM | POA: Diagnosis not present

## 2018-01-21 DIAGNOSIS — Z8371 Family history of colonic polyps: Secondary | ICD-10-CM | POA: Diagnosis not present

## 2018-04-12 DIAGNOSIS — Z809 Family history of malignant neoplasm, unspecified: Secondary | ICD-10-CM | POA: Diagnosis not present

## 2018-05-11 DIAGNOSIS — F4322 Adjustment disorder with anxiety: Secondary | ICD-10-CM | POA: Diagnosis not present

## 2018-06-17 DIAGNOSIS — F329 Major depressive disorder, single episode, unspecified: Secondary | ICD-10-CM | POA: Diagnosis not present

## 2018-12-07 DIAGNOSIS — J029 Acute pharyngitis, unspecified: Secondary | ICD-10-CM | POA: Diagnosis not present

## 2018-12-13 ENCOUNTER — Other Ambulatory Visit: Payer: Self-pay

## 2018-12-13 DIAGNOSIS — Z20822 Contact with and (suspected) exposure to covid-19: Secondary | ICD-10-CM

## 2018-12-15 LAB — NOVEL CORONAVIRUS, NAA: SARS-CoV-2, NAA: NOT DETECTED

## 2019-01-04 DIAGNOSIS — H6090 Unspecified otitis externa, unspecified ear: Secondary | ICD-10-CM | POA: Diagnosis not present

## 2019-01-04 DIAGNOSIS — H6122 Impacted cerumen, left ear: Secondary | ICD-10-CM | POA: Diagnosis not present

## 2019-01-04 DIAGNOSIS — F329 Major depressive disorder, single episode, unspecified: Secondary | ICD-10-CM | POA: Diagnosis not present

## 2019-01-18 DIAGNOSIS — Z0000 Encounter for general adult medical examination without abnormal findings: Secondary | ICD-10-CM | POA: Diagnosis not present

## 2019-01-25 DIAGNOSIS — Z23 Encounter for immunization: Secondary | ICD-10-CM | POA: Diagnosis not present

## 2019-01-25 DIAGNOSIS — Z13220 Encounter for screening for lipoid disorders: Secondary | ICD-10-CM | POA: Diagnosis not present

## 2019-01-25 DIAGNOSIS — E559 Vitamin D deficiency, unspecified: Secondary | ICD-10-CM | POA: Diagnosis not present

## 2019-01-25 DIAGNOSIS — Z79899 Other long term (current) drug therapy: Secondary | ICD-10-CM | POA: Diagnosis not present

## 2019-03-31 DIAGNOSIS — Z6821 Body mass index (BMI) 21.0-21.9, adult: Secondary | ICD-10-CM | POA: Diagnosis not present

## 2019-03-31 DIAGNOSIS — Z01419 Encounter for gynecological examination (general) (routine) without abnormal findings: Secondary | ICD-10-CM | POA: Diagnosis not present

## 2019-03-31 DIAGNOSIS — Z1231 Encounter for screening mammogram for malignant neoplasm of breast: Secondary | ICD-10-CM | POA: Diagnosis not present

## 2019-08-22 DIAGNOSIS — F329 Major depressive disorder, single episode, unspecified: Secondary | ICD-10-CM | POA: Diagnosis not present

## 2019-08-23 DIAGNOSIS — H04123 Dry eye syndrome of bilateral lacrimal glands: Secondary | ICD-10-CM | POA: Diagnosis not present

## 2019-08-23 DIAGNOSIS — H40013 Open angle with borderline findings, low risk, bilateral: Secondary | ICD-10-CM | POA: Diagnosis not present

## 2019-12-07 DIAGNOSIS — M79661 Pain in right lower leg: Secondary | ICD-10-CM | POA: Diagnosis not present

## 2019-12-07 DIAGNOSIS — S8991XA Unspecified injury of right lower leg, initial encounter: Secondary | ICD-10-CM | POA: Diagnosis not present

## 2019-12-07 DIAGNOSIS — M7989 Other specified soft tissue disorders: Secondary | ICD-10-CM | POA: Diagnosis not present

## 2020-01-24 DIAGNOSIS — Z1211 Encounter for screening for malignant neoplasm of colon: Secondary | ICD-10-CM | POA: Diagnosis not present

## 2020-01-24 DIAGNOSIS — Z86010 Personal history of colonic polyps: Secondary | ICD-10-CM | POA: Diagnosis not present

## 2020-01-24 DIAGNOSIS — K5904 Chronic idiopathic constipation: Secondary | ICD-10-CM | POA: Diagnosis not present

## 2020-01-24 DIAGNOSIS — R140 Abdominal distension (gaseous): Secondary | ICD-10-CM | POA: Diagnosis not present

## 2020-02-20 DIAGNOSIS — Z1211 Encounter for screening for malignant neoplasm of colon: Secondary | ICD-10-CM | POA: Diagnosis not present

## 2020-02-20 DIAGNOSIS — K5730 Diverticulosis of large intestine without perforation or abscess without bleeding: Secondary | ICD-10-CM | POA: Diagnosis not present

## 2020-02-23 DIAGNOSIS — Z13220 Encounter for screening for lipoid disorders: Secondary | ICD-10-CM | POA: Diagnosis not present

## 2020-02-23 DIAGNOSIS — Z79899 Other long term (current) drug therapy: Secondary | ICD-10-CM | POA: Diagnosis not present

## 2020-02-23 DIAGNOSIS — Z23 Encounter for immunization: Secondary | ICD-10-CM | POA: Diagnosis not present

## 2020-02-23 DIAGNOSIS — Z0000 Encounter for general adult medical examination without abnormal findings: Secondary | ICD-10-CM | POA: Diagnosis not present

## 2020-02-23 DIAGNOSIS — E559 Vitamin D deficiency, unspecified: Secondary | ICD-10-CM | POA: Diagnosis not present

## 2020-03-29 DIAGNOSIS — F4312 Post-traumatic stress disorder, chronic: Secondary | ICD-10-CM | POA: Diagnosis not present

## 2020-04-09 DIAGNOSIS — F4312 Post-traumatic stress disorder, chronic: Secondary | ICD-10-CM | POA: Diagnosis not present

## 2020-04-18 DIAGNOSIS — F4312 Post-traumatic stress disorder, chronic: Secondary | ICD-10-CM | POA: Diagnosis not present

## 2020-04-26 DIAGNOSIS — F4312 Post-traumatic stress disorder, chronic: Secondary | ICD-10-CM | POA: Diagnosis not present

## 2020-05-09 DIAGNOSIS — F4312 Post-traumatic stress disorder, chronic: Secondary | ICD-10-CM | POA: Diagnosis not present

## 2020-05-23 DIAGNOSIS — F4312 Post-traumatic stress disorder, chronic: Secondary | ICD-10-CM | POA: Diagnosis not present

## 2020-06-06 DIAGNOSIS — F4312 Post-traumatic stress disorder, chronic: Secondary | ICD-10-CM | POA: Diagnosis not present

## 2020-06-19 DIAGNOSIS — F4312 Post-traumatic stress disorder, chronic: Secondary | ICD-10-CM | POA: Diagnosis not present

## 2020-07-03 DIAGNOSIS — F4312 Post-traumatic stress disorder, chronic: Secondary | ICD-10-CM | POA: Diagnosis not present

## 2020-07-17 DIAGNOSIS — F4312 Post-traumatic stress disorder, chronic: Secondary | ICD-10-CM | POA: Diagnosis not present

## 2020-08-03 DIAGNOSIS — H04123 Dry eye syndrome of bilateral lacrimal glands: Secondary | ICD-10-CM | POA: Diagnosis not present

## 2020-08-03 DIAGNOSIS — Q141 Congenital malformation of retina: Secondary | ICD-10-CM | POA: Diagnosis not present

## 2020-08-03 DIAGNOSIS — H2513 Age-related nuclear cataract, bilateral: Secondary | ICD-10-CM | POA: Diagnosis not present

## 2020-08-03 DIAGNOSIS — H40013 Open angle with borderline findings, low risk, bilateral: Secondary | ICD-10-CM | POA: Diagnosis not present

## 2020-08-14 DIAGNOSIS — F4312 Post-traumatic stress disorder, chronic: Secondary | ICD-10-CM | POA: Diagnosis not present

## 2020-09-17 DIAGNOSIS — Z1231 Encounter for screening mammogram for malignant neoplasm of breast: Secondary | ICD-10-CM | POA: Diagnosis not present

## 2020-09-17 DIAGNOSIS — E559 Vitamin D deficiency, unspecified: Secondary | ICD-10-CM | POA: Diagnosis not present

## 2020-09-17 DIAGNOSIS — Z131 Encounter for screening for diabetes mellitus: Secondary | ICD-10-CM | POA: Diagnosis not present

## 2020-09-17 DIAGNOSIS — R5383 Other fatigue: Secondary | ICD-10-CM | POA: Diagnosis not present

## 2020-09-17 DIAGNOSIS — E785 Hyperlipidemia, unspecified: Secondary | ICD-10-CM | POA: Diagnosis not present

## 2020-09-17 DIAGNOSIS — Z01419 Encounter for gynecological examination (general) (routine) without abnormal findings: Secondary | ICD-10-CM | POA: Diagnosis not present

## 2020-09-28 DIAGNOSIS — F4312 Post-traumatic stress disorder, chronic: Secondary | ICD-10-CM | POA: Diagnosis not present

## 2021-03-13 DIAGNOSIS — Z0000 Encounter for general adult medical examination without abnormal findings: Secondary | ICD-10-CM | POA: Diagnosis not present

## 2021-03-13 DIAGNOSIS — Z79899 Other long term (current) drug therapy: Secondary | ICD-10-CM | POA: Diagnosis not present

## 2021-03-13 DIAGNOSIS — Z13220 Encounter for screening for lipoid disorders: Secondary | ICD-10-CM | POA: Diagnosis not present

## 2021-03-13 DIAGNOSIS — E559 Vitamin D deficiency, unspecified: Secondary | ICD-10-CM | POA: Diagnosis not present

## 2021-09-19 DIAGNOSIS — Z01419 Encounter for gynecological examination (general) (routine) without abnormal findings: Secondary | ICD-10-CM | POA: Diagnosis not present

## 2021-09-20 DIAGNOSIS — R102 Pelvic and perineal pain: Secondary | ICD-10-CM | POA: Diagnosis not present

## 2021-09-24 DIAGNOSIS — Z1231 Encounter for screening mammogram for malignant neoplasm of breast: Secondary | ICD-10-CM | POA: Diagnosis not present

## 2021-09-25 DIAGNOSIS — F329 Major depressive disorder, single episode, unspecified: Secondary | ICD-10-CM | POA: Diagnosis not present

## 2021-10-31 DIAGNOSIS — Z713 Dietary counseling and surveillance: Secondary | ICD-10-CM | POA: Diagnosis not present

## 2022-04-02 DIAGNOSIS — Z79899 Other long term (current) drug therapy: Secondary | ICD-10-CM | POA: Diagnosis not present

## 2022-04-02 DIAGNOSIS — E559 Vitamin D deficiency, unspecified: Secondary | ICD-10-CM | POA: Diagnosis not present

## 2022-04-02 DIAGNOSIS — Z0000 Encounter for general adult medical examination without abnormal findings: Secondary | ICD-10-CM | POA: Diagnosis not present

## 2022-04-02 DIAGNOSIS — Z13220 Encounter for screening for lipoid disorders: Secondary | ICD-10-CM | POA: Diagnosis not present

## 2022-04-24 DIAGNOSIS — D225 Melanocytic nevi of trunk: Secondary | ICD-10-CM | POA: Diagnosis not present

## 2022-04-24 DIAGNOSIS — D485 Neoplasm of uncertain behavior of skin: Secondary | ICD-10-CM | POA: Diagnosis not present

## 2022-04-24 DIAGNOSIS — L821 Other seborrheic keratosis: Secondary | ICD-10-CM | POA: Diagnosis not present

## 2022-04-24 DIAGNOSIS — L218 Other seborrheic dermatitis: Secondary | ICD-10-CM | POA: Diagnosis not present

## 2022-04-24 DIAGNOSIS — L300 Nummular dermatitis: Secondary | ICD-10-CM | POA: Diagnosis not present

## 2022-07-03 DIAGNOSIS — N952 Postmenopausal atrophic vaginitis: Secondary | ICD-10-CM | POA: Diagnosis not present

## 2022-07-03 DIAGNOSIS — R102 Pelvic and perineal pain: Secondary | ICD-10-CM | POA: Diagnosis not present

## 2022-07-03 DIAGNOSIS — L218 Other seborrheic dermatitis: Secondary | ICD-10-CM | POA: Diagnosis not present

## 2022-07-03 DIAGNOSIS — N83201 Unspecified ovarian cyst, right side: Secondary | ICD-10-CM | POA: Diagnosis not present

## 2022-07-03 DIAGNOSIS — N930 Postcoital and contact bleeding: Secondary | ICD-10-CM | POA: Diagnosis not present

## 2022-07-23 DIAGNOSIS — Z713 Dietary counseling and surveillance: Secondary | ICD-10-CM | POA: Diagnosis not present

## 2022-09-06 DIAGNOSIS — R112 Nausea with vomiting, unspecified: Secondary | ICD-10-CM | POA: Diagnosis not present

## 2022-09-06 DIAGNOSIS — Z79899 Other long term (current) drug therapy: Secondary | ICD-10-CM | POA: Diagnosis not present

## 2022-09-06 DIAGNOSIS — R1032 Left lower quadrant pain: Secondary | ICD-10-CM | POA: Diagnosis not present

## 2022-09-06 DIAGNOSIS — R109 Unspecified abdominal pain: Secondary | ICD-10-CM | POA: Diagnosis not present

## 2022-09-06 DIAGNOSIS — K5900 Constipation, unspecified: Secondary | ICD-10-CM | POA: Diagnosis not present

## 2022-09-12 ENCOUNTER — Other Ambulatory Visit: Payer: Self-pay | Admitting: Family Medicine

## 2022-09-12 ENCOUNTER — Other Ambulatory Visit: Payer: Self-pay

## 2022-09-12 ENCOUNTER — Ambulatory Visit
Admission: RE | Admit: 2022-09-12 | Discharge: 2022-09-12 | Disposition: A | Discharge status: Home / Self Care | Payer: BC Managed Care – PPO | Source: Ambulatory Visit | Attending: Family Medicine | Admitting: Family Medicine

## 2022-09-12 DIAGNOSIS — M25552 Pain in left hip: Secondary | ICD-10-CM

## 2022-09-12 DIAGNOSIS — R1032 Left lower quadrant pain: Secondary | ICD-10-CM

## 2022-09-12 DIAGNOSIS — M1612 Unilateral primary osteoarthritis, left hip: Secondary | ICD-10-CM | POA: Diagnosis not present

## 2022-09-17 ENCOUNTER — Ambulatory Visit
Admission: RE | Admit: 2022-09-17 | Discharge: 2022-09-17 | Disposition: A | Discharge status: Home / Self Care | Payer: BC Managed Care – PPO | Source: Ambulatory Visit | Attending: Family Medicine | Admitting: Family Medicine

## 2022-09-17 DIAGNOSIS — R1032 Left lower quadrant pain: Secondary | ICD-10-CM

## 2022-09-17 DIAGNOSIS — N83201 Unspecified ovarian cyst, right side: Secondary | ICD-10-CM | POA: Diagnosis not present

## 2022-09-30 DIAGNOSIS — M1990 Unspecified osteoarthritis, unspecified site: Secondary | ICD-10-CM | POA: Diagnosis not present

## 2022-09-30 DIAGNOSIS — F411 Generalized anxiety disorder: Secondary | ICD-10-CM | POA: Diagnosis not present

## 2022-09-30 DIAGNOSIS — N83209 Unspecified ovarian cyst, unspecified side: Secondary | ICD-10-CM | POA: Diagnosis not present

## 2022-10-21 DIAGNOSIS — H40013 Open angle with borderline findings, low risk, bilateral: Secondary | ICD-10-CM | POA: Diagnosis not present

## 2022-12-31 DIAGNOSIS — E7800 Pure hypercholesterolemia, unspecified: Secondary | ICD-10-CM | POA: Diagnosis not present

## 2022-12-31 DIAGNOSIS — E559 Vitamin D deficiency, unspecified: Secondary | ICD-10-CM | POA: Diagnosis not present

## 2022-12-31 DIAGNOSIS — Z23 Encounter for immunization: Secondary | ICD-10-CM | POA: Diagnosis not present

## 2022-12-31 DIAGNOSIS — F411 Generalized anxiety disorder: Secondary | ICD-10-CM | POA: Diagnosis not present

## 2023-01-29 DIAGNOSIS — Z1231 Encounter for screening mammogram for malignant neoplasm of breast: Secondary | ICD-10-CM | POA: Diagnosis not present

## 2023-01-29 DIAGNOSIS — Z01419 Encounter for gynecological examination (general) (routine) without abnormal findings: Secondary | ICD-10-CM | POA: Diagnosis not present

## 2023-01-29 DIAGNOSIS — N83201 Unspecified ovarian cyst, right side: Secondary | ICD-10-CM | POA: Diagnosis not present

## 2023-04-10 DIAGNOSIS — Z0000 Encounter for general adult medical examination without abnormal findings: Secondary | ICD-10-CM | POA: Diagnosis not present

## 2023-04-10 DIAGNOSIS — Z13220 Encounter for screening for lipoid disorders: Secondary | ICD-10-CM | POA: Diagnosis not present

## 2023-04-10 DIAGNOSIS — Z79899 Other long term (current) drug therapy: Secondary | ICD-10-CM | POA: Diagnosis not present

## 2023-04-10 DIAGNOSIS — E559 Vitamin D deficiency, unspecified: Secondary | ICD-10-CM | POA: Diagnosis not present

## 2023-04-28 DIAGNOSIS — L821 Other seborrheic keratosis: Secondary | ICD-10-CM | POA: Diagnosis not present

## 2023-04-28 DIAGNOSIS — L718 Other rosacea: Secondary | ICD-10-CM | POA: Diagnosis not present

## 2023-04-28 DIAGNOSIS — D225 Melanocytic nevi of trunk: Secondary | ICD-10-CM | POA: Diagnosis not present

## 2023-04-28 DIAGNOSIS — D485 Neoplasm of uncertain behavior of skin: Secondary | ICD-10-CM | POA: Diagnosis not present

## 2023-06-08 DIAGNOSIS — D485 Neoplasm of uncertain behavior of skin: Secondary | ICD-10-CM | POA: Diagnosis not present

## 2023-06-08 DIAGNOSIS — D225 Melanocytic nevi of trunk: Secondary | ICD-10-CM | POA: Diagnosis not present

## 2023-07-14 DIAGNOSIS — H5213 Myopia, bilateral: Secondary | ICD-10-CM | POA: Diagnosis not present

## 2023-07-14 DIAGNOSIS — H2513 Age-related nuclear cataract, bilateral: Secondary | ICD-10-CM | POA: Diagnosis not present

## 2023-07-14 DIAGNOSIS — H04123 Dry eye syndrome of bilateral lacrimal glands: Secondary | ICD-10-CM | POA: Diagnosis not present

## 2023-07-14 DIAGNOSIS — H40013 Open angle with borderline findings, low risk, bilateral: Secondary | ICD-10-CM | POA: Diagnosis not present

## 2023-07-14 DIAGNOSIS — H52223 Regular astigmatism, bilateral: Secondary | ICD-10-CM | POA: Diagnosis not present

## 2023-07-14 DIAGNOSIS — H524 Presbyopia: Secondary | ICD-10-CM | POA: Diagnosis not present

## 2023-07-14 DIAGNOSIS — Q141 Congenital malformation of retina: Secondary | ICD-10-CM | POA: Diagnosis not present

## 2023-10-13 DIAGNOSIS — F329 Major depressive disorder, single episode, unspecified: Secondary | ICD-10-CM | POA: Diagnosis not present

## 2024-02-04 DIAGNOSIS — Z1231 Encounter for screening mammogram for malignant neoplasm of breast: Secondary | ICD-10-CM | POA: Diagnosis not present

## 2024-02-04 DIAGNOSIS — Z01419 Encounter for gynecological examination (general) (routine) without abnormal findings: Secondary | ICD-10-CM | POA: Diagnosis not present

## 2024-02-15 DIAGNOSIS — N83201 Unspecified ovarian cyst, right side: Secondary | ICD-10-CM | POA: Diagnosis not present
